# Patient Record
Sex: Male | Born: 1985 | Race: Black or African American | Hispanic: No | Marital: Married | Smoking: Current every day smoker
Health system: Southern US, Community
[De-identification: ages and names within clinical notes are randomized; demographics above are authoritative.]

---

## 2012-05-14 ENCOUNTER — Emergency Department: Payer: Self-pay | Admitting: Emergency Medicine

## 2012-06-05 ENCOUNTER — Emergency Department: Payer: Self-pay | Admitting: *Deleted

## 2015-12-20 ENCOUNTER — Emergency Department
Admission: EM | Admit: 2015-12-20 | Discharge: 2015-12-20 | Disposition: A | Discharge status: Home / Self Care | Payer: BLUE CROSS/BLUE SHIELD | Attending: Emergency Medicine | Admitting: Emergency Medicine

## 2015-12-20 ENCOUNTER — Emergency Department: Payer: BLUE CROSS/BLUE SHIELD

## 2015-12-20 ENCOUNTER — Encounter: Payer: Self-pay | Admitting: *Deleted

## 2015-12-20 DIAGNOSIS — F17200 Nicotine dependence, unspecified, uncomplicated: Secondary | ICD-10-CM | POA: Diagnosis not present

## 2015-12-20 DIAGNOSIS — K047 Periapical abscess without sinus: Secondary | ICD-10-CM

## 2015-12-20 DIAGNOSIS — K029 Dental caries, unspecified: Secondary | ICD-10-CM | POA: Insufficient documentation

## 2015-12-20 DIAGNOSIS — K0889 Other specified disorders of teeth and supporting structures: Secondary | ICD-10-CM | POA: Diagnosis present

## 2015-12-20 DIAGNOSIS — K0381 Cracked tooth: Secondary | ICD-10-CM | POA: Diagnosis not present

## 2015-12-20 LAB — CBC WITH DIFFERENTIAL/PLATELET
BASOS ABS: 0.2 10*3/uL — AB (ref 0–0.1)
BASOS PCT: 3 %
EOS PCT: 1 %
Eosinophils Absolute: 0 10*3/uL (ref 0–0.7)
HEMATOCRIT: 41.9 % (ref 40.0–52.0)
Hemoglobin: 14 g/dL (ref 13.0–18.0)
LYMPHS PCT: 28 %
Lymphs Abs: 2.2 10*3/uL (ref 1.0–3.6)
MCH: 29.9 pg (ref 26.0–34.0)
MCHC: 33.5 g/dL (ref 32.0–36.0)
MCV: 89.4 fL (ref 80.0–100.0)
MONO ABS: 0.6 10*3/uL (ref 0.2–1.0)
Monocytes Relative: 8 %
NEUTROS ABS: 4.9 10*3/uL (ref 1.4–6.5)
Neutrophils Relative %: 62 %
PLATELETS: 172 10*3/uL (ref 150–440)
RBC: 4.69 MIL/uL (ref 4.40–5.90)
RDW: 16.7 % — AB (ref 11.5–14.5)
WBC: 8 10*3/uL (ref 3.8–10.6)

## 2015-12-20 LAB — BASIC METABOLIC PANEL
ANION GAP: 5 (ref 5–15)
BUN: 10 mg/dL (ref 6–20)
CALCIUM: 8.8 mg/dL — AB (ref 8.9–10.3)
CO2: 27 mmol/L (ref 22–32)
Chloride: 106 mmol/L (ref 101–111)
Creatinine, Ser: 0.98 mg/dL (ref 0.61–1.24)
GFR calc Af Amer: >60 mL/min (ref >60)
GLUCOSE: 96 mg/dL (ref 65–99)
POTASSIUM: 4.4 mmol/L (ref 3.5–5.1)
SODIUM: 138 mmol/L (ref 135–145)

## 2015-12-20 MED ORDER — CLINDAMYCIN HCL 300 MG PO CAPS: 300.0000 mg | ORAL_CAPSULE | Freq: Four times a day (QID) | ORAL | Status: DC

## 2015-12-20 MED ORDER — LIDOCAINE VISCOUS 2 % MT SOLN: 20.0000 mL | OROMUCOSAL | Status: DC | PRN

## 2015-12-20 MED ORDER — OXYCODONE-ACETAMINOPHEN 5-325 MG PO TABS: 1.0000 | ORAL_TABLET | ORAL | Status: DC | PRN

## 2015-12-20 MED ORDER — IOHEXOL 300 MG/ML  SOLN
100.0000 mL | Freq: Once | INTRAMUSCULAR | Status: DC | PRN
  Filled 2015-12-20: qty 100

## 2015-12-20 NOTE — ED Notes (Signed)
Pt c/o R tooth pain.  Pt sts that this has been an ongoing problem, but that this last "cycle" began 3-4 days ago.  Pt sts that he has not seen PCP or dentist for issue.

## 2015-12-20 NOTE — Discharge Instructions (Signed)
OPTIONS FOR DENTAL FOLLOW UP CARE ° °[place] Department [facility] Health and Human Services - Local Safety Net Dental Clinics °http://www.ncdhhs.gov/dph/oralhealth/services/safetynetclinics.htm °  °[facility] (336-562-3123) ° °[facility] [place] (919-933-9087) ° °[facility] (919-663-1744 ext [phone]) ° °[facility] ([phone]) ° °[facility] ([phone]) °This clinic caters to the indigent population and is on a lottery system. °Location: °[facility], [address], [place] °Clinic Hours: °Wednesdays from 6pm - 9pm, patients seen by a lottery system. °For dates, call or go to www.med.[facility].edu/[facility]/patients/Dental-[facility] °Services: °Cleanings, fillings and simple extractions. °Payment Options: °DENTAL WORK IS FREE OF CHARGE. Bring proof of income or support. °Best way to get seen: °Arrive at 5:15 pm - this is a lottery, NOT first come/first serve, so arriving earlier will not increase your chances of being seen. °  °  °[facility] °[phone] °Select option 1 for emergencies °  °Location: °[facility], [address], [place] °Clinic Hours: °No walk-ins accepted - call the day before to schedule an appointment. °Check in times are 9:30 am and 1:30 pm. °Services: °Simple extractions, temporary fillings, pulpectomy/pulp debridement, uncomplicated abscess drainage. °Payment Options: °PAYMENT IS DUE AT THE TIME OF SERVICE.  Fee is usually $100-200, additional surgical procedures (e.g. abscess drainage) may be extra. °Cash, checks, Visa/MasterCard accepted.  Can file Medicaid if patient is covered for dental - patient should call case worker to check. °No discount for [facility] patients. °Best way to get seen: °MUST call the day before and get onto the schedule. Can usually be seen the next 1-2 days. No walk-ins accepted. °  °  °[facility] °[phone] °   °Location: °[facility], [address], [place] °Clinic Hours: °M, W, Th, F 8am or 1:30pm, Tues 9a or 1:30 - first come/first served. °Services: °Simple extractions, temporary fillings, uncomplicated abscess drainage.  You do not need to be an [place] resident. °Payment Options: °PAYMENT IS DUE AT THE TIME OF SERVICE. °Dental insurance, otherwise sliding scale - bring proof of income or support. °Depending on income and treatment needed, cost is usually $50-200. °Best way to get seen: °Arrive early as it is first come/first served. °  °  °[facility] °[phone] °  °Location: °[address] °Clinic Hours: °Mon-Thu 8a-5p °Services: °Most basic dental services including extractions and fillings. °Payment Options: °PAYMENT IS DUE AT THE TIME OF SERVICE. °Sliding scale, up to 50% off - bring proof if income or support. °Medicaid with dental option accepted. °Best way to get seen: °Call to schedule an appointment, can usually be seen within 2 weeks OR they will try to see walk-ins - show up at 8a or 2p (you may have to wait). °  °  °[facility] °[phone] °[place] RESIDENTS ONLY °  °Location: °[facility], [address], [place], [place] [address] °Clinic Hours: By appointment only. °Monday - Thursday 8am-5pm, Friday 8am-12pm °Services: Cleanings, fillings, extractions. °Payment Options: °PAYMENT IS DUE AT THE TIME OF SERVICE. °Cash, Visa or MasterCard. Sliding scale - $30 minimum per service. °Best way to get seen: °Come in to office, complete packet and make an appointment - need proof of income °or support monies for each household member and proof of [place] residence. °Usually takes about a month to get in. °  °  °[facility] °[phone] °  °Location: °[address]   [place] °Clinic Hours: Walk-in Urgent Care Dental Services are offered Monday-Friday  mornings only. °The numbers of emergencies accepted daily is limited to the number of °providers available. °Maximum 15 - Mondays, Wednesdays & Thursdays °Maximum 10 - Tuesdays & Fridays °Services: °You do not need to be a  [place] resident to be seen for a dental emergency. °Emergencies are defined as pain, swelling, abnormal bleeding, or dental trauma. Walkins will receive x-rays if needed. °NOTE: Dental cleaning is not an emergency. °Payment Options: °PAYMENT IS DUE AT THE TIME OF SERVICE. °Minimum co-pay is $40.00 for uninsured patients. °Minimum co-pay is $3.00 for Medicaid with dental coverage. °Dental Insurance is accepted and must be presented at time of visit. °Medicare does not cover dental. °Forms of payment: Cash, credit card, checks. °Best way to get seen: °If not previously registered with the clinic, walk-in dental registration begins at 7:15 am and is on a first come/first serve basis. °If previously registered with the clinic, call to make an appointment. °  °  °[facility] °[phone] °[place] RESIDENTS ONLY °  °Location: °[address], [place], [place] °Clinic Hours: °Mon-Thu 10a-2p °Services: Extractions only! °Payment Options: °FREE (donations accepted) - bring proof of income or support °Best way to get seen: °Call and schedule an appointment OR come at 8am on the 1st Monday of every month (except for holidays) when it is first come/first served. °  °  °[facility] °[phone] °  °Location: °[address], [place] °Clinic Hours: °Friday mornings °Services, Payment Options, Best way to get seen: °Call for info ° ° °Dental Abscess °A dental abscess is a collection of pus in or around a tooth. °CAUSES °This condition is caused by a bacterial infection around the root of the tooth that involves the inner part of the tooth (pulp). It may result from: °· Severe tooth decay. °· Trauma to the tooth that allows bacteria to enter into the pulp, such as a broken or chipped  tooth. °· Severe gum disease around a tooth. °SYMPTOMS °Symptoms of this condition include: °· Severe pain in and around the infected tooth. °· Swelling and redness around the infected tooth, in the mouth, or in the face. °· Tenderness. °· Pus drainage. °· Bad breath. °· Bitter taste in the mouth. °· Difficulty swallowing. °· Difficulty opening the mouth. °· Nausea. °· Vomiting. °· Chills. °· Swollen neck glands. °· Fever. °DIAGNOSIS °This condition is diagnosed with examination of the infected tooth. During the exam, your dentist may tap on the infected tooth. Your dentist will also ask about your medical and dental history and may order X-rays. °TREATMENT °This condition is treated by eliminating the infection. This may be done with: °· Antibiotic medicine. °· A root canal. This may be performed to save the tooth. °· Pulling (extracting) the tooth. This may also involve draining the abscess. This is done if the tooth cannot be saved. °HOME CARE INSTRUCTIONS °· Take medicines only as directed by your dentist. °· If you were prescribed antibiotic medicine, finish all of it even if you start to feel better. °· Rinse your mouth (gargle) often with salt water to relieve pain or swelling. °· Do not drive or operate heavy machinery while taking pain medicine. °· Do not apply heat to the outside of your mouth. °· Keep all follow-up visits as directed by your dentist. This is important. °SEEK MEDICAL CARE IF: °· Your pain is worse and is not helped by medicine. °SEEK IMMEDIATE MEDICAL CARE IF: °·   You have a fever or chills. °· Your symptoms suddenly get worse. °· You have a very bad headache. °· You have problems breathing or swallowing. °· You have trouble opening your mouth. °· You have swelling in your neck or around your eye. °  °This information is not intended to replace advice given to you by your health care provider. Make sure you discuss any questions you have with your health care provider. °  °Document  Released: 11/18/2005 Document Revised: 04/04/2015 Document Reviewed: 11/15/2014 °Elsevier Interactive Patient Education ©2016 Else[employer] Inc[employer] ° °

## 2015-12-20 NOTE — ED Notes (Signed)
Patient c/o dental pain and swelling on right lower jaw.

## 2015-12-20 NOTE — ED Provider Notes (Signed)
[facility] Emergency Department Provider Note  ____________________________________________  Time seen: Approximately 12:40 PM  I have reviewed the triage vital signs and the nursing notes.   HISTORY  Chief Complaint Dental Pain    HPI Adrian Cannon is a 30 y.o. male presents for evaluation of right tooth pain and facial swelling and facial numbness. Patient states he started with an abscess approximately 3 days ago and has progressively gotten worse since then.   History reviewed. No pertinent past medical history.  There are no active problems to display for this patient.   History reviewed. No pertinent past surgical history.  Current Outpatient Rx  Name  Route  Sig  Dispense  Refill  . clindamycin (CLEOCIN) 300 MG capsule   Oral   Take 1 capsule (300 mg total) by mouth 4 (four) times daily.   30 capsule   0   . lidocaine (XYLOCAINE) 2 % solution   Mouth/Throat   Use as directed 20 mLs in the mouth or throat as needed for mouth pain.   100 mL   0   . oxyCODONE-acetaminophen (ROXICET) 5-325 MG tablet   Oral   Take 1-2 tablets by mouth every 4 (four) hours as needed for severe pain.   15 tablet   0     Allergies Review of patient's allergies indicates no known allergies.  No family history on file.  Social History Social History  Substance Use Topics  . Smoking status: Current Every Day Smoker  . Smokeless tobacco: None  . Alcohol Use: No    Review of Systems Constitutional: No fever/chills Eyes: No visual changes. ENT: Positive dental pain and swollen jaw. Cardiovascular: Denies chest pain. Respiratory: Denies shortness of breath. Gastrointestinal: No abdominal pain.  No nausea, no vomiting.  No diarrhea.  No constipation. Genitourinary: Negative for dysuria. Musculoskeletal: Negative for back pain. Skin: Negative for rash. Neurological: Negative for headaches, focal weakness or numbness.  10-point ROS otherwise  negative.  ____________________________________________   PHYSICAL EXAM:  VITAL SIGNS: ED Triage Vitals  Enc Vitals Group     BP 12/20/15 1213 142/95 mmHg     Pulse Rate 12/20/15 1213 93     Resp 12/20/15 1213 18     Temp 12/20/15 1213 98.6 F (37 C)     Temp Source 12/20/15 1213 Oral     SpO2 12/20/15 1213 100 %     Weight 12/20/15 1213 180 lb (81.647 kg)     Height 12/20/15 1213  (1.854 m)     Head Cir --      Peak Flow --      Pain Score 12/20/15 1214 6     Pain Loc --      Pain Edu? --      Excl. in GC? --     Constitutional: Alert and oriented. Well appearing and in no acute distress. Eyes: Conjunctivae are normal. PERRL. EOMI. Head: Right-sided facial edema noted with tenderness to the lower jaw. Obvious dental caries noted with fractured tooth in the right lower molar. Unable to fully extend mouth. Nose: No congestion/rhinnorhea. Mouth/Throat: Mucous membranes are moist.  Oropharynx non-erythematous. Neck: No stridor.   Neurologic:  Normal speech and language. No gross focal neurologic deficits are appreciated. No gait instability. Skin:  Skin is warm, dry and intact. No rash noted. Psychiatric: Mood and affect are normal. Speech and behavior are normal.  ____________________________________________   LABS (all labs ordered are listed, but only abnormal results are displayed)  Labs Reviewed  BASIC METABOLIC PANEL - Abnormal; Notable for the following:    Calcium 8.8 (*)    All other components within normal limits  CBC WITH DIFFERENTIAL/PLATELET - Abnormal; Notable for the following:    RDW 16.7 (*)    Basophils Absolute 0.2 (*)    All other components within normal limits    RADIOLOGY  IMPRESSION: Periapical abscess formation about a lower right tooth, 29. Ill-defined fluid and soft tissues along the lateral margin of the mandible about this tooth is compatible with phlegmon formation. No discrete abscess is  seen. ____________________________________________   PROCEDURES  Procedure(s) performed: None  Critical Care performed: No  ____________________________________________   INITIAL IMPRESSION / ASSESSMENT AND PLAN / ED COURSE  Pertinent labs & imaging results that were available during my care of the patient were reviewed by me and considered in my medical decision making (see chart for details).  Periapical abscess right lower jaw. Rx given for clindamycin 150 mg 4 times a day #40. Rx given for Percocet 5/325, and viscous lidocaine. Patient encouraged follow-up with a dentist as scheduled. ____________________________________________   FINAL CLINICAL IMPRESSION(S) / ED DIAGNOSES  Final diagnoses:  Periapical abscess with facial involvement      Evangeline Dakin, PA-C 12/20/15 1439  Arnaldo Natal, MD 12/20/15 [phone]

## 2016-05-29 ENCOUNTER — Encounter: Payer: Self-pay | Admitting: Emergency Medicine

## 2016-05-29 ENCOUNTER — Emergency Department
Admission: EM | Admit: 2016-05-29 | Discharge: 2016-05-29 | Disposition: A | Discharge status: Home / Self Care | Payer: BLUE CROSS/BLUE SHIELD | Attending: Emergency Medicine | Admitting: Emergency Medicine

## 2016-05-29 DIAGNOSIS — K047 Periapical abscess without sinus: Secondary | ICD-10-CM | POA: Insufficient documentation

## 2016-05-29 DIAGNOSIS — F17200 Nicotine dependence, unspecified, uncomplicated: Secondary | ICD-10-CM | POA: Insufficient documentation

## 2016-05-29 MED ORDER — MAGIC MOUTHWASH W/LIDOCAINE: 5.0000 mL | Freq: Four times a day (QID) | ORAL | Status: DC

## 2016-05-29 MED ORDER — HYDROCODONE-ACETAMINOPHEN 5-325 MG PO TABS: 1.0000 | ORAL_TABLET | ORAL | Status: DC | PRN

## 2016-05-29 MED ORDER — CLINDAMYCIN HCL 300 MG PO CAPS: 300.0000 mg | ORAL_CAPSULE | Freq: Four times a day (QID) | ORAL | Status: DC

## 2016-05-29 NOTE — Discharge Instructions (Signed)
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
## 2016-09-04 ENCOUNTER — Emergency Department: Payer: BLUE CROSS/BLUE SHIELD

## 2016-09-04 ENCOUNTER — Emergency Department
Admission: EM | Admit: 2016-09-04 | Discharge: 2016-09-04 | Disposition: A | Discharge status: Home / Self Care | Payer: BLUE CROSS/BLUE SHIELD | Attending: Student | Admitting: Student

## 2016-09-04 ENCOUNTER — Encounter: Payer: Self-pay | Admitting: Emergency Medicine

## 2016-09-04 DIAGNOSIS — W108XXA Fall (on) (from) other stairs and steps, initial encounter: Secondary | ICD-10-CM | POA: Insufficient documentation

## 2016-09-04 DIAGNOSIS — Y929 Unspecified place or not applicable: Secondary | ICD-10-CM | POA: Insufficient documentation

## 2016-09-04 DIAGNOSIS — F17200 Nicotine dependence, unspecified, uncomplicated: Secondary | ICD-10-CM | POA: Insufficient documentation

## 2016-09-04 DIAGNOSIS — S161XXA Strain of muscle, fascia and tendon at neck level, initial encounter: Secondary | ICD-10-CM | POA: Insufficient documentation

## 2016-09-04 DIAGNOSIS — S39012A Strain of muscle, fascia and tendon of lower back, initial encounter: Secondary | ICD-10-CM | POA: Insufficient documentation

## 2016-09-04 DIAGNOSIS — Y9389 Activity, other specified: Secondary | ICD-10-CM | POA: Insufficient documentation

## 2016-09-04 DIAGNOSIS — S300XXA Contusion of lower back and pelvis, initial encounter: Secondary | ICD-10-CM

## 2016-09-04 DIAGNOSIS — Y999 Unspecified external cause status: Secondary | ICD-10-CM | POA: Insufficient documentation

## 2016-09-04 MED ORDER — TRAMADOL HCL 50 MG PO TABS
50.0000 mg | ORAL_TABLET | Freq: Four times a day (QID) | ORAL | 0 refills | Status: AC | PRN
Start: 2016-09-04 — End: ?

## 2016-09-04 MED ORDER — IBUPROFEN 600 MG PO TABS: 600.0000 mg | ORAL_TABLET | Freq: Three times a day (TID) | ORAL | 0 refills | Status: AC | PRN

## 2016-09-04 MED ORDER — ONDANSETRON 4 MG PO TBDP
4.0000 mg | ORAL_TABLET | Freq: Once | ORAL | Status: AC
  Administered 2016-09-04: 4 mg via ORAL
  Filled 2016-09-04: qty 1

## 2016-09-04 MED ORDER — MORPHINE SULFATE (PF) 4 MG/ML IV SOLN
4.0000 mg | Freq: Once | INTRAVENOUS | Status: AC
  Administered 2016-09-04: 4 mg via INTRAMUSCULAR
  Filled 2016-09-04: qty 1

## 2016-09-04 NOTE — ED Notes (Signed)
Patient transported to X-ray 

## 2016-09-04 NOTE — ED Notes (Signed)
Cervical collar placed per MD Inocencio HomesGayle

## 2016-09-04 NOTE — Discharge Instructions (Signed)
As we discussed, you need to follow up as soon as possible with a primary care doctor regarding the enlarged lymph nodes in your neck to make sure that this does not represent something serious like a chronic infection or cancer. Return immediately to the emergency department if you develops severe or worsening neck or back or head pain, vision change, numbness or weakness in the arms or legs, loss of control of you bowels or bladder, fevers, chest pain, difficulty breathing, or for any other concerns.
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
## 2016-09-04 NOTE — ED Provider Notes (Signed)
[facility] Emergency Department Provider Note   ____________________________________________   First MD Initiated Contact with Patient 09/04/16 0805     (approximate)  I have reviewed the triage vital signs and the nursing notes.   HISTORY  Chief Complaint Fall    HPI Adrian Cannon is a 29 y.o. male with no chronic medical illness who presents for evaluation of headache and back pain after a mechanical fall that occurred suddenly just prior to arrival, his pain has been constant since onset, worse with movement, currently severe. Patient reports that he was chasing after his small son to give the child something that he forgot on the way to school when he slipped and fell down 10 steps. He did hit his head but he denies loss of consciousness. He reports "I don't think I blacked out but things got fuzzy for a minute". He is complaining of neck pain as well as back pain. No chest pain, no difficulty breathing, no recent illness including no cough, vomiting, diarrhea, fevers or chills, no numbness or weakness, no vision change, no loss of control of bowel or bladder.   History reviewed. No pertinent past medical history.  There are no active problems to display for this patient.   History reviewed. No pertinent surgical history.  Prior to Admission medications   Not on File    Allergies Review of patient's allergies indicates no known allergies.  History reviewed. No pertinent family history.  Social History Social History  Substance Use Topics  . Smoking status: Current Every Day Smoker  . Smokeless tobacco: Not on file  . Alcohol use No    Review of Systems Constitutional: No fever/chills Eyes: No visual changes. ENT: No sore throat. Cardiovascular: Denies chest pain. Respiratory: Denies shortness of breath. Gastrointestinal: No abdominal pain.  No nausea, no vomiting.  No diarrhea.  No constipation. Genitourinary: Negative for  dysuria. Musculoskeletal: Negative for back pain. Skin: Negative for rash. Neurological: Positive for headache, no focal weakness or numbness.  10-point ROS otherwise negative.  ____________________________________________   PHYSICAL EXAM:  Vitals:   09/04/16 0759 09/04/16 0800 09/04/16 0819  BP:  (!) 114/94 (!) 114/94  Pulse:  87 87  Resp:   18  Temp:   97.8 F (36.6 C)  TempSrc:   Oral  SpO2:  100% 100%  Weight: 170 lb (77.1 kg)    Height: 6\' 1"  (1.854 m)      VITAL SIGNS: ED Triage Vitals [09/04/16 0759]  Enc Vitals Group     BP      Pulse      Resp      Temp      Temp src      SpO2      Weight 170 lb (77.1 kg)     Height 6\' 1"  (1.854 m)     Head Circumference      Peak Flow      Pain Score 7     Pain Loc      Pain Edu?      Excl. in GC?     Constitutional: Alert and oriented. Nontoxic-appearing, in mild distress due to pain. Eyes: Conjunctivae are normal. PERRL. EOMI. Head: Tenderness in the occiput without bony step-off or deformity. No Battle sign or raccoon's eyes. Nose: No congestion/rhinnorhea. Mouth/Throat: Mucous membranes are moist.  Oropharynx non-erythematous. Neck: No stridor.  No midline cervical spine tenderness to palpation, tenderness in the right paravertebral muscles of the C-spine. Cardiovascular: Normal rate,  regular rhythm. Grossly normal heart sounds.  Good peripheral circulation. Respiratory: Normal respiratory effort.  No retractions. Lungs CTAB. Gastrointestinal: Soft and nontender. No distention.  No CVA tenderness. Genitourinary: Deferred Musculoskeletal: No lower extremity tenderness nor edema.  No joint effusions. Tenderness to palpation throughout the T and the L-spine in the midline as well as in the right paravertebral muscles associated with the lumbar spine. Pelvis stable to rock and compression, full active painless range of motion at the hip joints bilaterally. Neurologic:  Normal speech and language. No gross focal  neurologic deficits are appreciated. No gait instability. 5 out of 5 strength of dorsiflexion of the big toes bilaterally, no muscle sensation in the saddle distribution. Skin:  Skin is warm, dry and intact. No rash noted. Psychiatric: Mood and affect are normal. Speech and behavior are normal.  ____________________________________________   LABS (all labs ordered are listed, but only abnormal results are displayed)  Labs Reviewed - No data to display ____________________________________________  EKG  none ____________________________________________  RADIOLOGY  CT head and c-spine FINDINGS:  CT HEAD FINDINGS    Brain: No evidence of acute infarction, hemorrhage, hydrocephalus,  extra-axial collection or mass lesion/mass effect. Normal cerebral  volume without white matter disease.    Vascular: No hyperdense vessel or unexpected calcification.    Skull: Normal. Negative for fracture or focal lesion.    Sinuses/Orbits: No acute finding.    Other: None.    CT CERVICAL SPINE FINDINGS    Alignment: Anatomic    Skull base and vertebrae: No acute fracture. No primary bone lesion  or focal pathologic process.    Soft tissues and spinal canal: No prevertebral fluid or swelling. No  visible canal hematoma.    Disc levels: No visible disc herniation or loss of disc height.    Upper chest: Not assessed.    Other: Incompletely evaluated on this noncontrast cervical spine CT  is bulky BILATERAL cervical adenopathy. Correlate with physical  exam. If this is a new/unexpected finding, consider CT neck with  contrast for further evaluation.    IMPRESSION:  Negative CT head. No skull fracture or intracranial hemorrhage.    No cervical spine fracture or traumatic subluxation.    Incompletely evaluated is bulky cervical adenopathy. See discussion  above.      Xray thoracic spine   IMPRESSION:  No acute osseous abnormalities.       Xray lumbar  spine IMPRESSION:  No acute osseous abnormalities.      ____________________________________________   PROCEDURES  Procedure(s) performed: None  Procedures  Critical Care performed: No  ____________________________________________   INITIAL IMPRESSION / ASSESSMENT AND PLAN / ED COURSE  Pertinent labs & imaging results that were available during my care of the patient were reviewed by me and considered in my medical decision making (see chart for details).  Carrie MewCurtis W Bjorn Cannon is a 30 y.o. male with no chronic medical illness who presents for evaluation of headache and back pain after a mechanical fall that occurred suddenly just prior to arrival. On exam, he is in mild distress due to pain. Vital signs stable, he is afebrile. C-collar applied in the emergency department. He is neurovascularly intact, plan for CT head, C-spine as well as plain films of the thoracic and lumbar spine. We'll treat his pain and reassess for disposition.  ----------------------------------------- 10:47 AM on 09/04/2016 ----------------------------------------- Patient with significant improvement of his pain at this time, he is resting comfortably. His imaging is negative for any acute traumatic pathology. C collar  cleared by me. I discussed with him the finding of the bulky lymphadenopathy noted on his CT C-spine. I discussed with him that he needs to follow-up quickly with the primary care doctor for reevaluation and for recheck and possibly additional testing so that underlying chronic infection or cancer can be ruled out. He voices understanding of this. We discussed meticulous return precautions and need for close PCP follow-up and he is comfortable with the discharge plan. DC home.  Clinical Course     ____________________________________________   FINAL CLINICAL IMPRESSION(S) / ED DIAGNOSES  Final diagnoses:  Fall down stairs, initial encounter  Lumbar strain, initial encounter    Contusion of lower back, initial encounter      NEW MEDICATIONS STARTED DURING THIS VISIT:  New Prescriptions   No medications on file     Note:  This document was prepared using Dragon voice recognition software and may include unintentional dictation errors.    Gayla Doss, MD 09/04/16 1053

## 2016-09-04 NOTE — ED Notes (Signed)
Cervical collar removed per MD Inocencio HomesGayle.

## 2016-09-04 NOTE — ED Notes (Signed)
Pt verbalized understanding of discharge instructions. NAD at this time. 

## 2018-05-06 IMAGING — CR DG LUMBAR SPINE 2-3V
3 series · 3 of 3 positions shown · non-contrast
Comparison: None

CLINICAL DATA: Slipped and fell today down 10 metal stairs, back
and neck pain, initial encounter

EXAM:
LUMBAR SPINE - 2-3 VIEW

[l-spine ap]
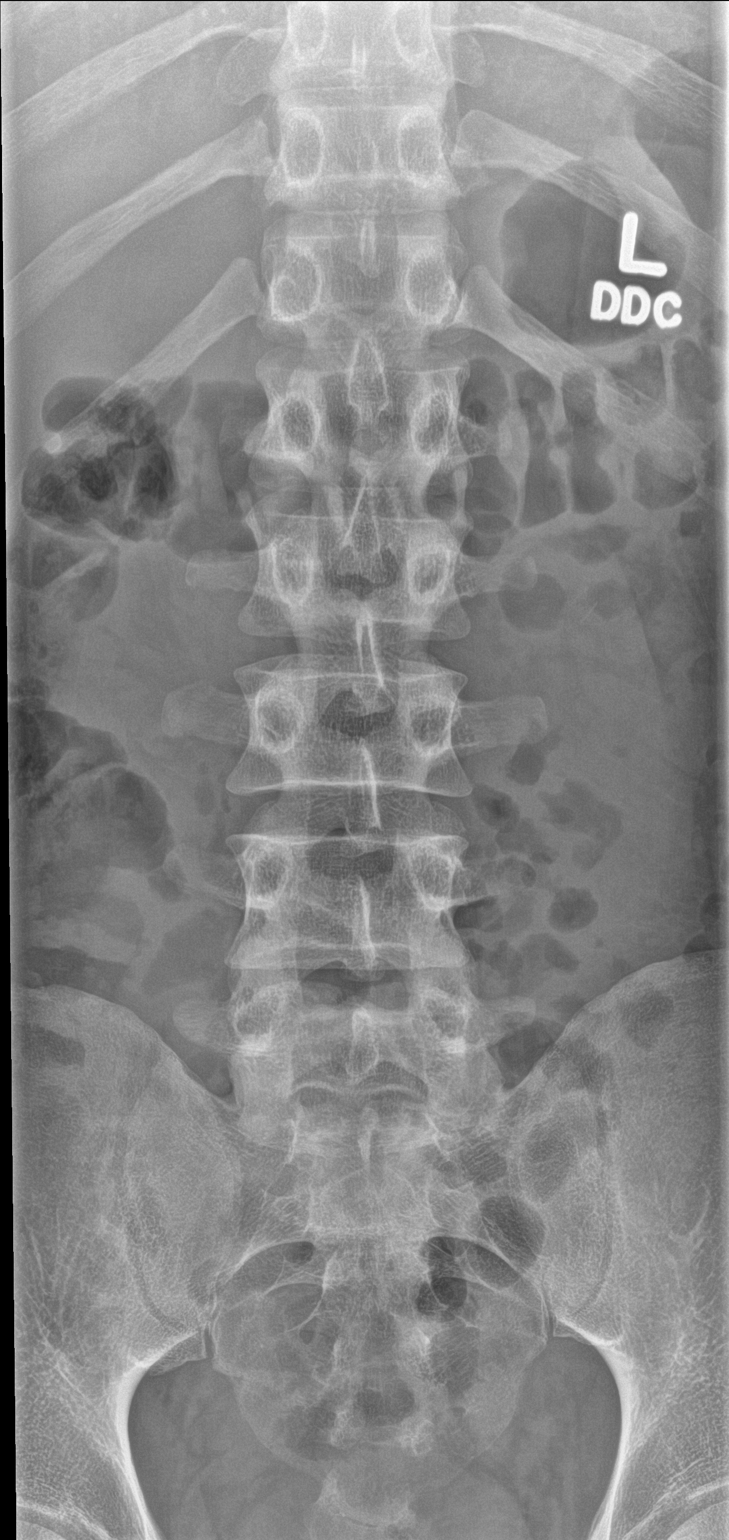

[l-spine lat]
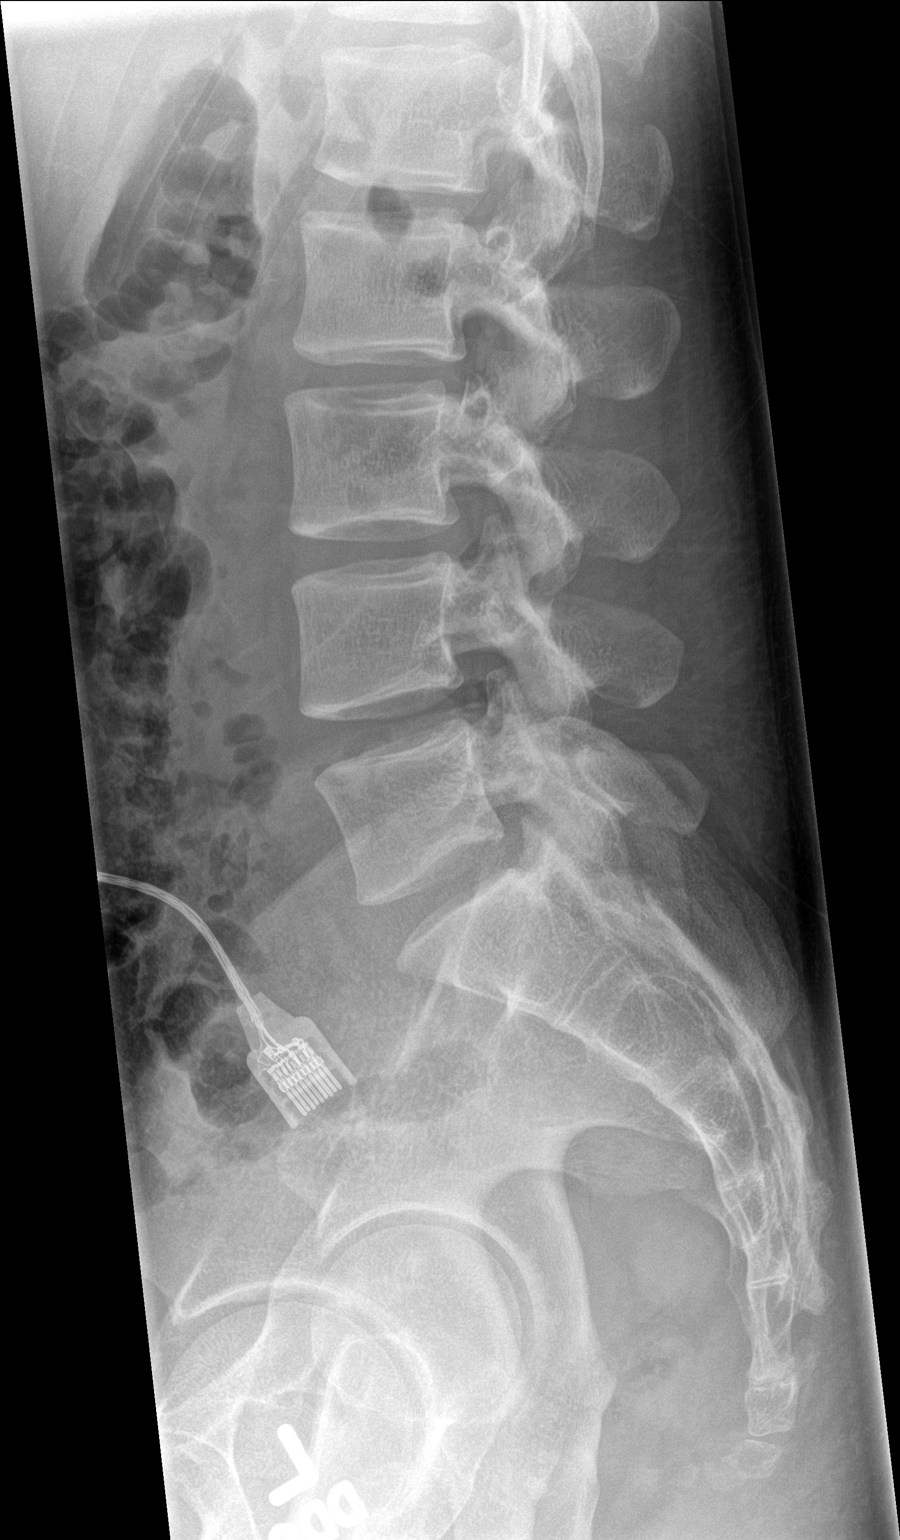

[l-spine spot]
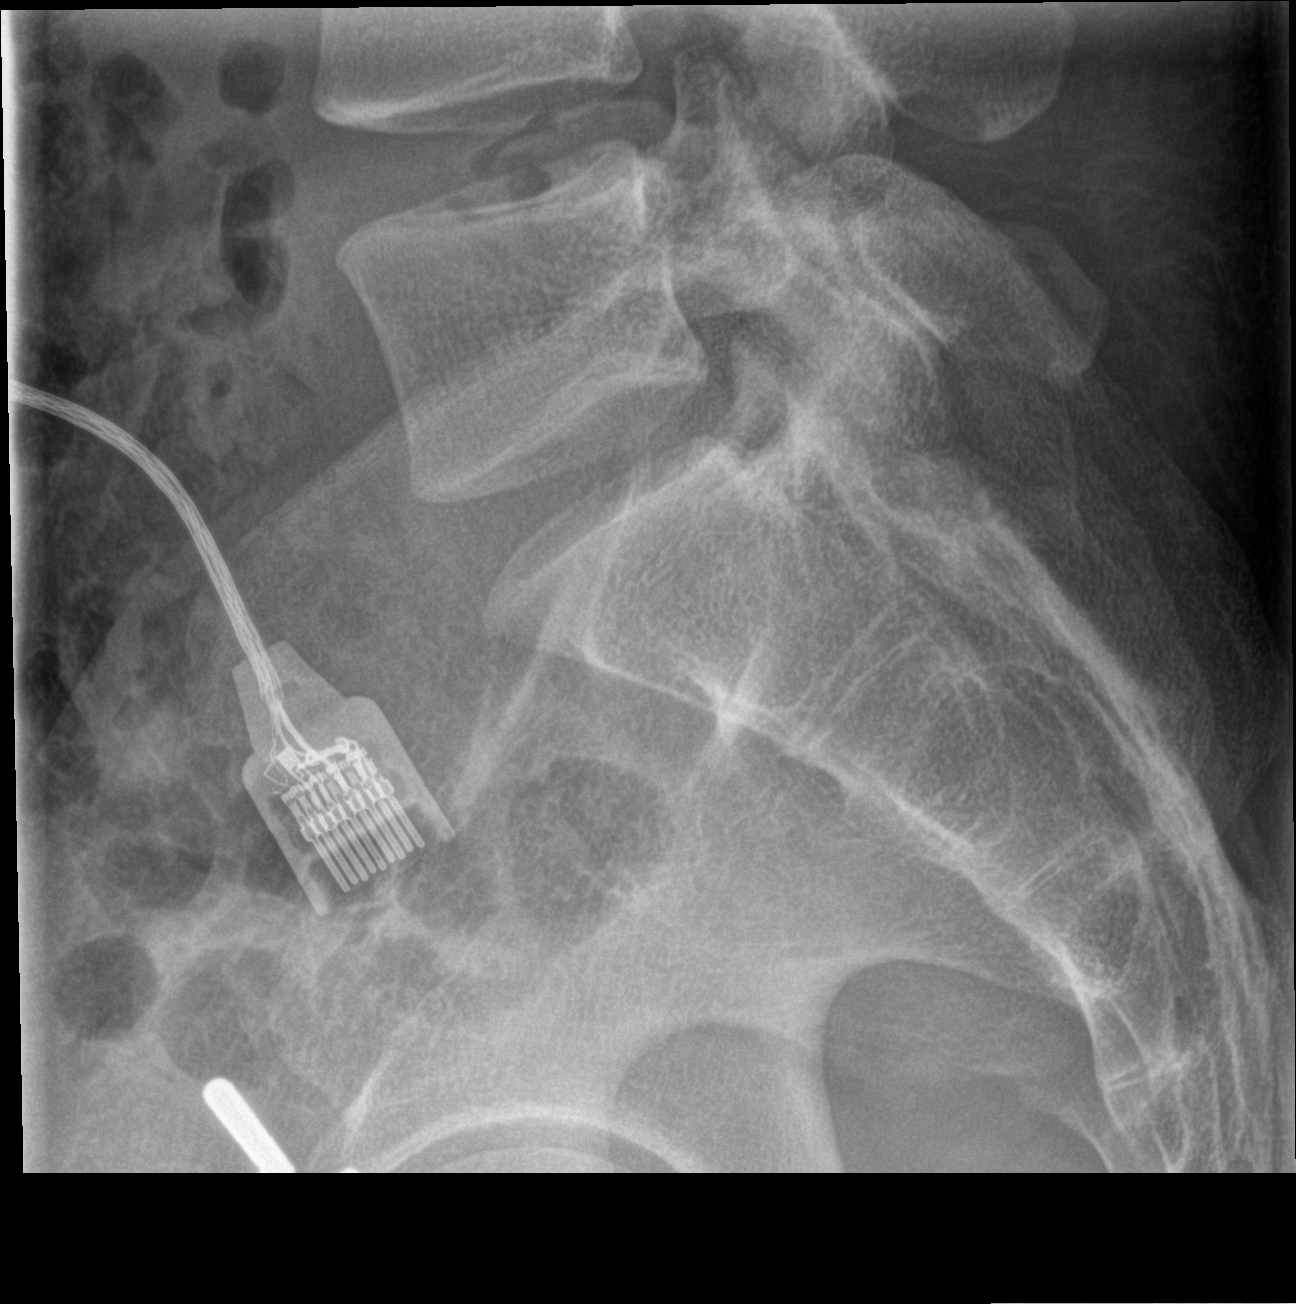

[3 of 3 positions shown; findings below may reference images not displayed]

FINDINGS: Five non-rib bearing lumbar vertebrae.

Osseous mineralization normal.

Vertebral body and disc space heights maintained.

No acute fracture, subluxation, or bone destruction.

No gross evidence of spondylolysis identified though oblique views
were not requested.

SI joints symmetric.
IMPRESSION: No acute osseous abnormalities.

## 2018-05-06 IMAGING — CT CT CERVICAL SPINE W/O CM
4 of 7 series · 15 of 33 positions shown, 16 images · non-contrast
Comparison: None.

CLINICAL DATA: Patient slipped and fell today on 10 metal stairs.
Headache and pain. Uncertain loss of consciousness.

EXAM:
CT HEAD WITHOUT CONTRAST
CT CERVICAL SPINE WITHOUT CONTRAST
TECHNIQUE: Multidetector CT imaging of the head and cervical spine was
performed following the standard protocol without intravenous
contrast. Multiplanar CT image reconstructions of the cervical spine
were also generated.

[Series 4: coronal soft tissue · coronal · 0.28mm/px · 2 of 66 slices shown]
[im 22/66  bone]
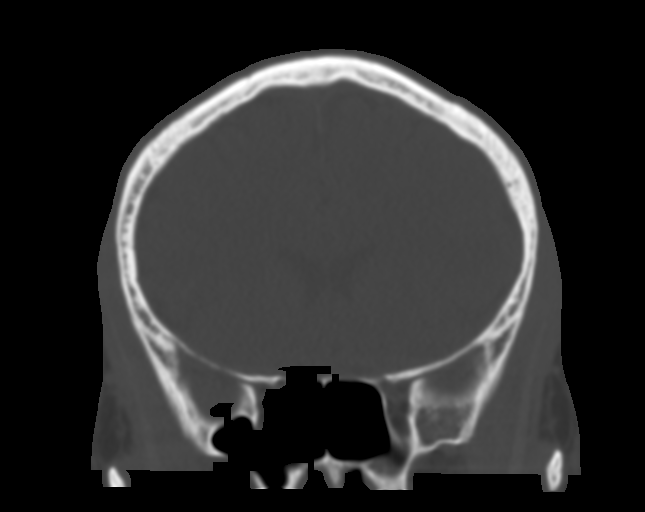
[im 44/66  bone]
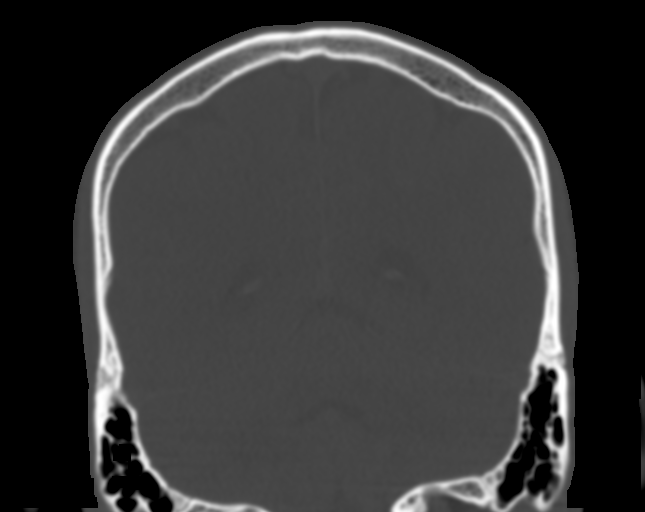

[Series 7: c spine soft · axial · 0.26mm/px · z∈[-220,-124]mm · 4 of 80 slices shown]
[im 16/80  soft-tissue]
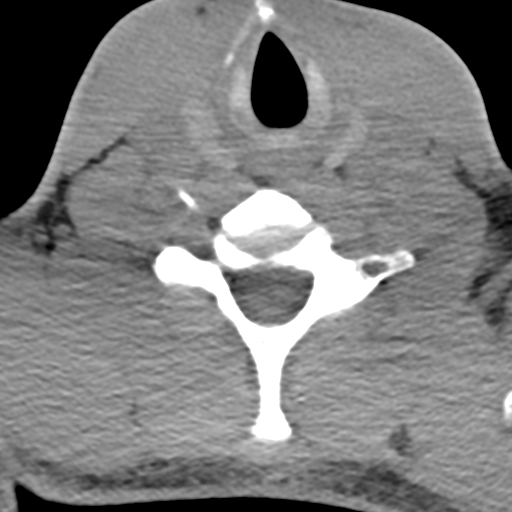
[im 32/80  soft-tissue]
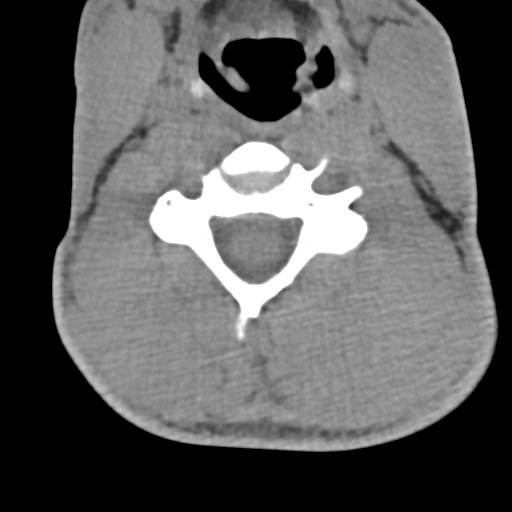
[im 48/80  soft-tissue]
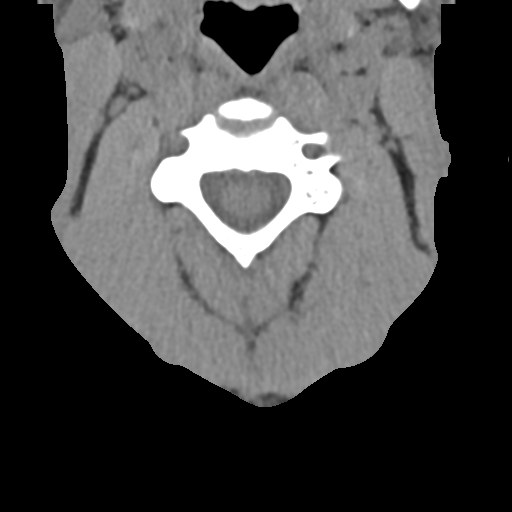
[im 64/80  soft-tissue]
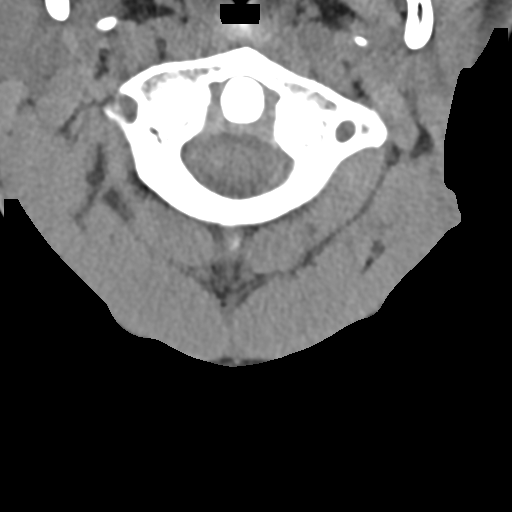

[Series 10: sagittal bone · sagittal · 0.23mm/px · 5 of 61 slices shown]
[im 11/61  bone]
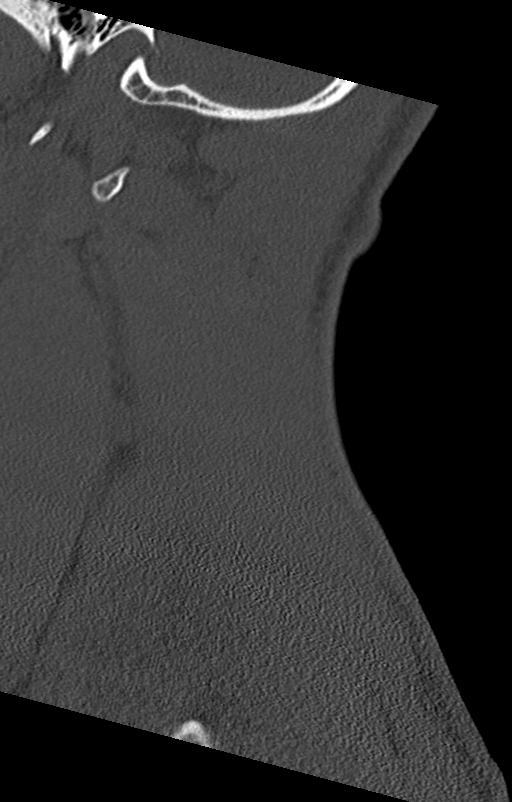
[im 21/61  bone]
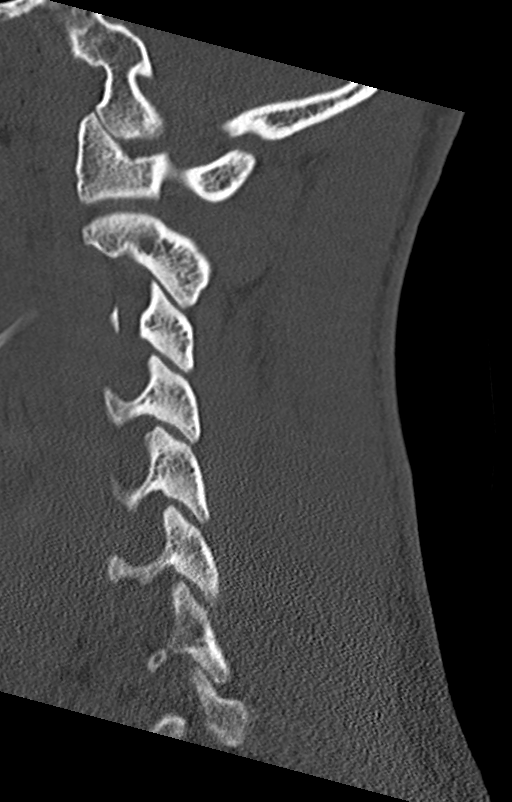
[im 31/61  bone]
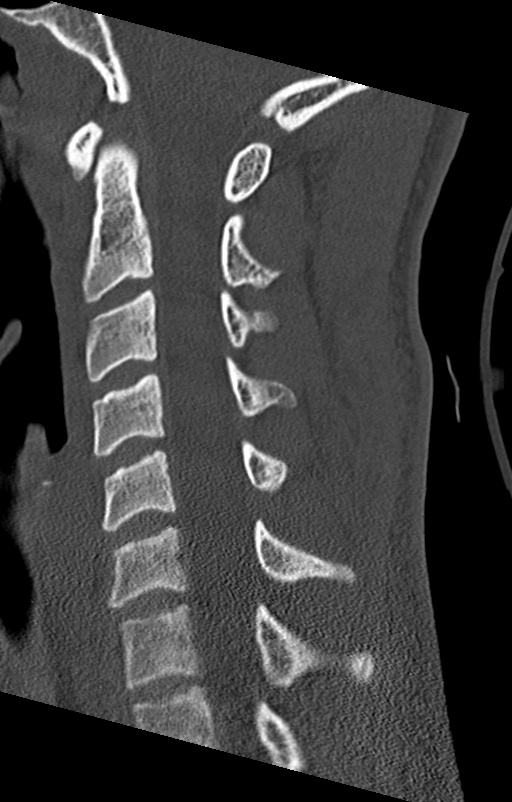
[im 41/61  bone]
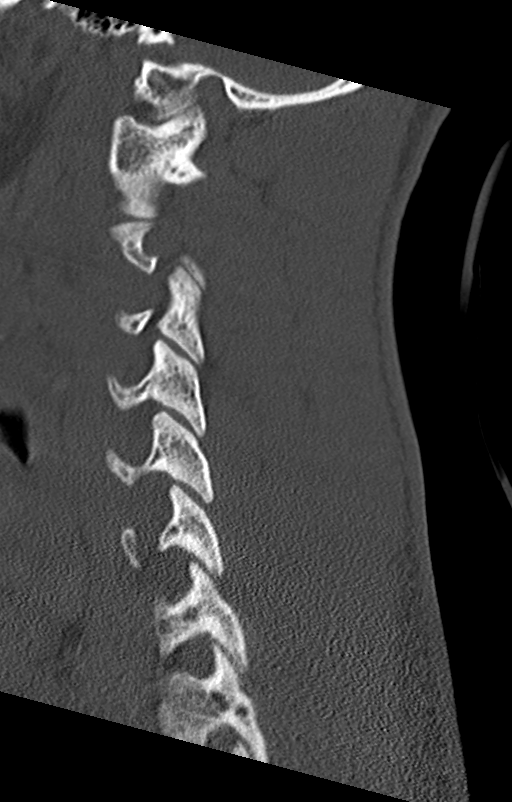
[im 51/61  bone]
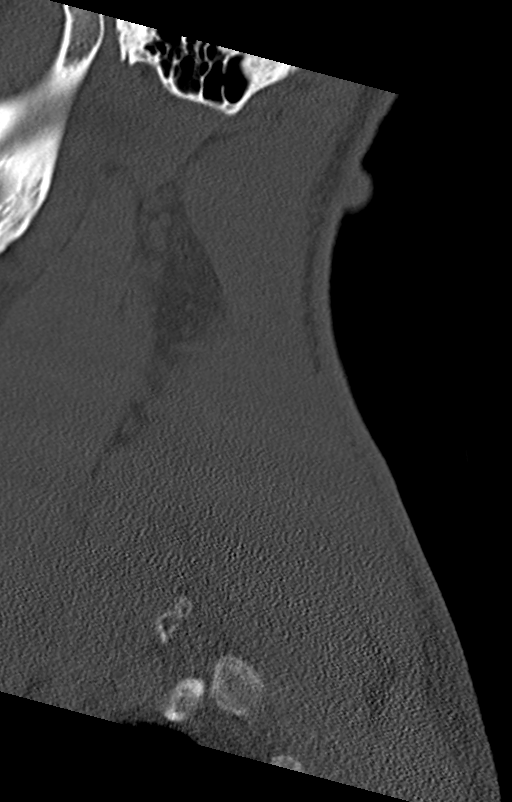

[Series 12: orthogonal bone · axial · 0.23mm/px · z∈[-242,-135]mm · 4 of 93 slices shown, 5 images]
[im 19/93  soft-tissue]
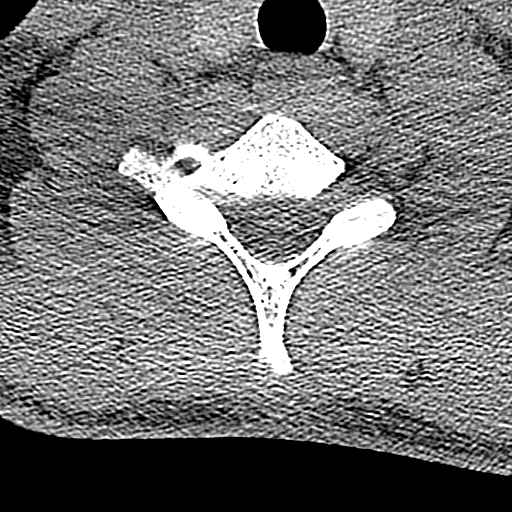
[im 19/93  bone]
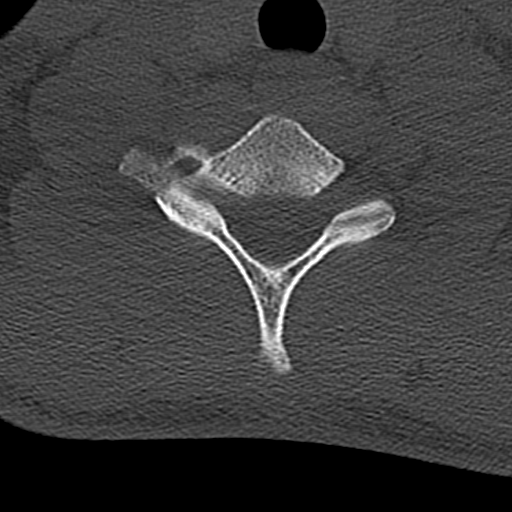
[im 37/93  bone]
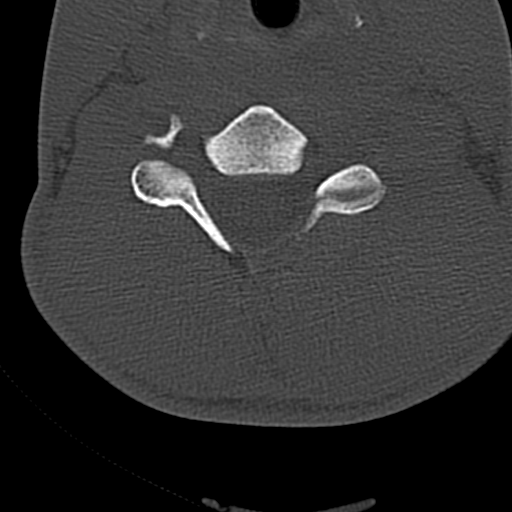
[im 56/93  bone]
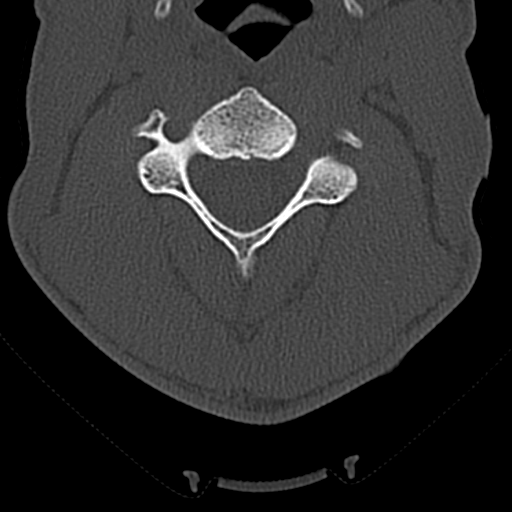
[im 74/93  bone]
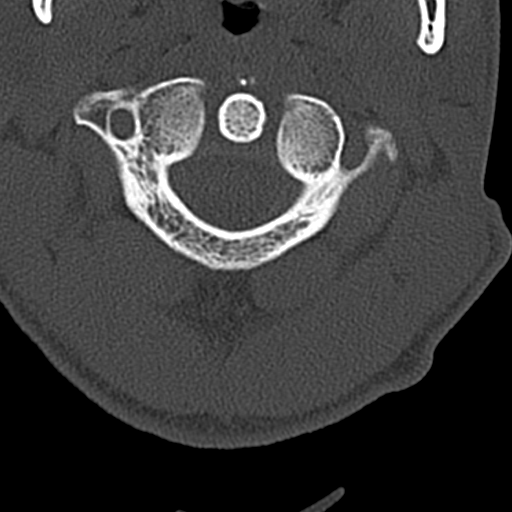

[15 of 33 positions shown; findings below may reference images not displayed]

FINDINGS: CT HEAD FINDINGS

Brain: No evidence of acute infarction, hemorrhage, hydrocephalus,
extra-axial collection or mass lesion/mass effect. Normal cerebral
volume without white matter disease.

Vascular: No hyperdense vessel or unexpected calcification.

Skull: Normal. Negative for fracture or focal lesion.

Sinuses/Orbits: No acute finding.

Other: None.

CT CERVICAL SPINE FINDINGS

Alignment: Anatomic

Skull base and vertebrae: No acute fracture. No primary bone lesion
or focal pathologic process.

Soft tissues and spinal canal: No prevertebral fluid or swelling. No
visible canal hematoma.

Disc levels:  No visible disc herniation or loss of disc height.

Upper chest: Not assessed.

Other: Incompletely evaluated on this noncontrast cervical spine CT
is bulky BILATERAL cervical adenopathy. Correlate with physical
exam. If this is a new/unexpected finding, consider CT neck with
contrast for further evaluation.
IMPRESSION: Negative CT head.  No skull fracture or intracranial hemorrhage.

No cervical spine fracture or traumatic subluxation.

Incompletely evaluated is bulky cervical adenopathy. See discussion
above.
